# Patient Record
Sex: Male | Born: 1975 | Race: White | Hispanic: No | Marital: Married
Health system: Southern US, Community
[De-identification: ages and names within clinical notes are randomized; demographics above are authoritative.]

---

## 2010-11-01 ENCOUNTER — Emergency Department (HOSPITAL_BASED_OUTPATIENT_CLINIC_OR_DEPARTMENT_OTHER)
Admission: EM | Admit: 2010-11-01 | Discharge: 2010-11-01 | Disposition: A | Discharge status: Home / Self Care | Payer: BC Managed Care – PPO | Attending: Emergency Medicine | Admitting: Emergency Medicine

## 2010-11-01 ENCOUNTER — Emergency Department (INDEPENDENT_AMBULATORY_CARE_PROVIDER_SITE_OTHER): Payer: BC Managed Care – PPO

## 2010-11-01 DIAGNOSIS — R55 Syncope and collapse: Secondary | ICD-10-CM

## 2010-11-01 DIAGNOSIS — R079 Chest pain, unspecified: Secondary | ICD-10-CM

## 2010-11-01 DIAGNOSIS — J45909 Unspecified asthma, uncomplicated: Secondary | ICD-10-CM | POA: Insufficient documentation

## 2010-11-01 LAB — COMPREHENSIVE METABOLIC PANEL
ALT: 53 U/L (ref 0–53)
AST: 51 U/L — ABNORMAL HIGH (ref 0–37)
CO2: 30 mEq/L (ref 19–32)
Calcium: 9.2 mg/dL (ref 8.4–10.5)
GFR calc Af Amer: >60 mL/min (ref >60)
Sodium: 142 mEq/L (ref 135–145)
Total Protein: 7.5 g/dL (ref 6.0–8.3)

## 2010-11-01 LAB — CBC
Hemoglobin: 15 g/dL (ref 13.0–17.0)
MCHC: 36.1 g/dL — ABNORMAL HIGH (ref 30.0–36.0)
RDW: 11.9 % (ref 11.5–15.5)
WBC: 7.6 10*3/uL (ref 4.0–10.5)

## 2010-11-01 LAB — POCT CARDIAC MARKERS
CKMB, poc: <1 ng/mL — ABNORMAL LOW (ref 1.0–8.0)
Troponin i, poc: <0.05 ng/mL (ref 0.00–0.09)

## 2010-11-01 LAB — DIFFERENTIAL
Basophils Absolute: 0 10*3/uL (ref 0.0–0.1)
Basophils Relative: 0 % (ref 0–1)
Monocytes Absolute: 0.6 10*3/uL (ref 0.1–1.0)
Neutro Abs: 4.6 10*3/uL (ref 1.7–7.7)

## 2012-04-25 IMAGING — CT CT HEAD W/O CM
1 series · 16 of 30 positions shown, 20 images · non-contrast
Comparison: None

CLINICAL DATA: Syncope.  Head trauma.

CT HEAD WITHOUT CONTRAST
TECHNIQUE: Contiguous axial images were obtained from the base of
the skull through the vertex without contrast.

[Series 2: head 4.8 h37s · axial · 0.48mm/px · z∈[-163,-11]mm · 16 of 36 slices shown, 20 images]
[im 2/36  brain]
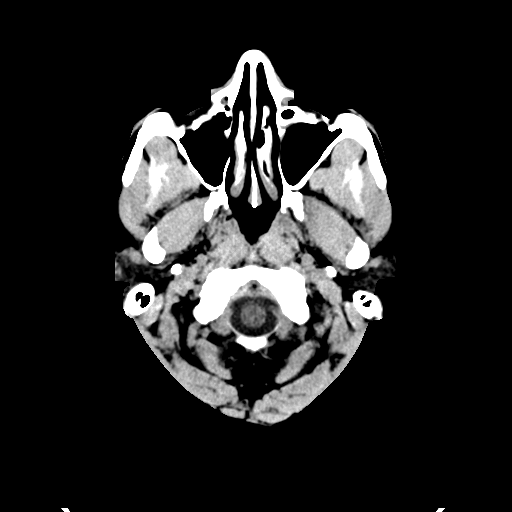
[im 2/36  bone]
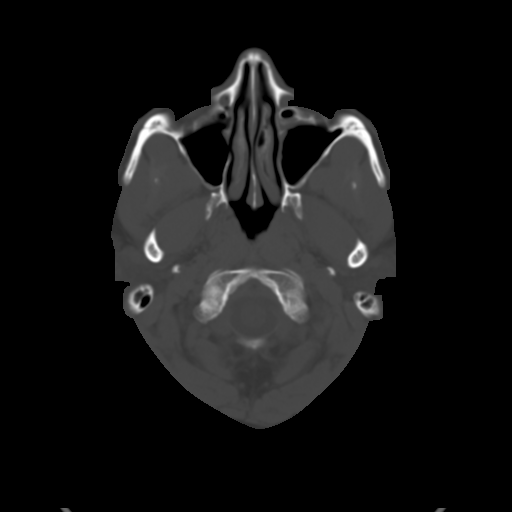
[im 4/36  brain]
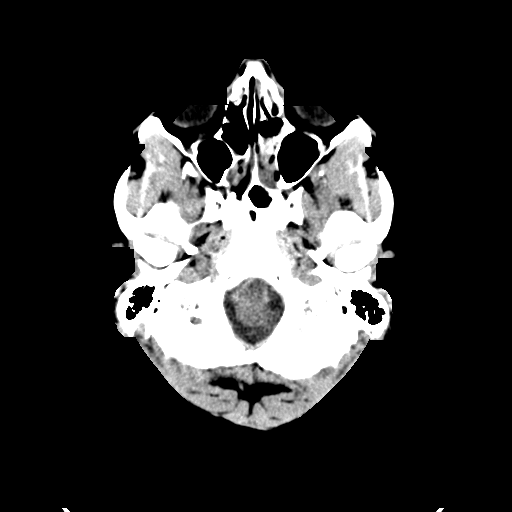
[im 7/36  brain]
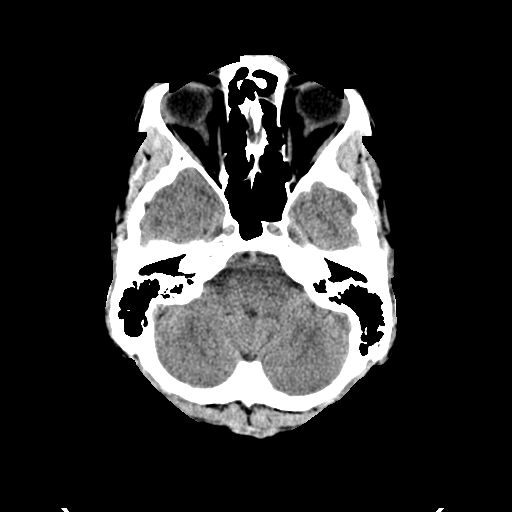
[im 9/36  brain]
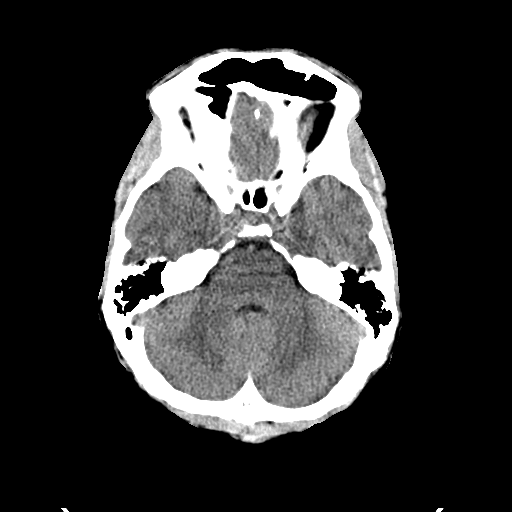
[im 10/36  brain]
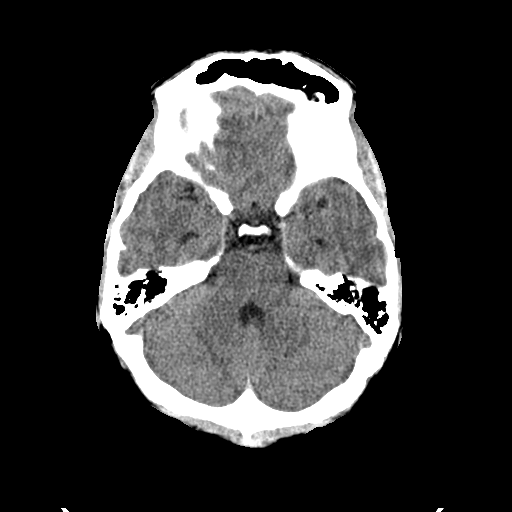
[im 10/36  bone]
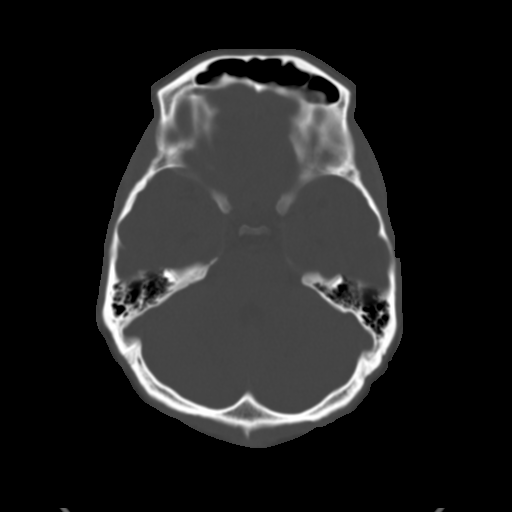
[im 13/36  brain]
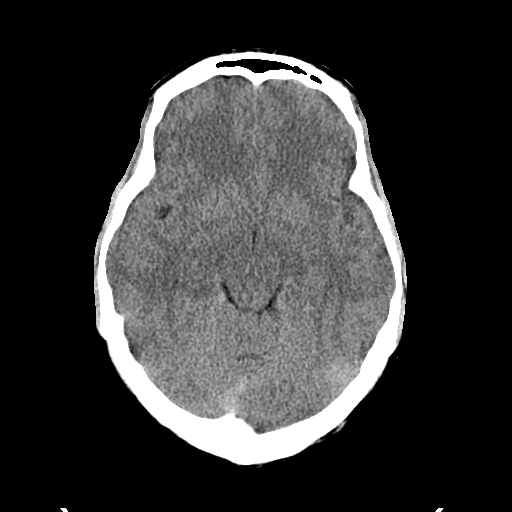
[im 15/36  brain]
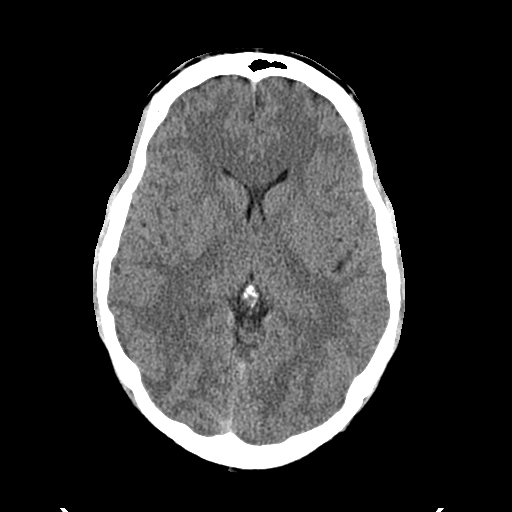
[im 17/36  brain]
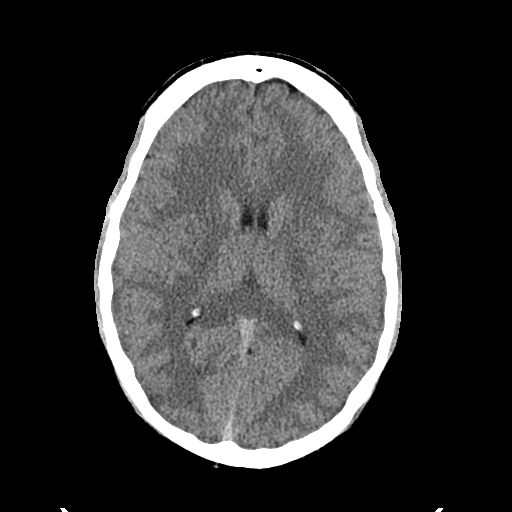
[im 19/36  brain]
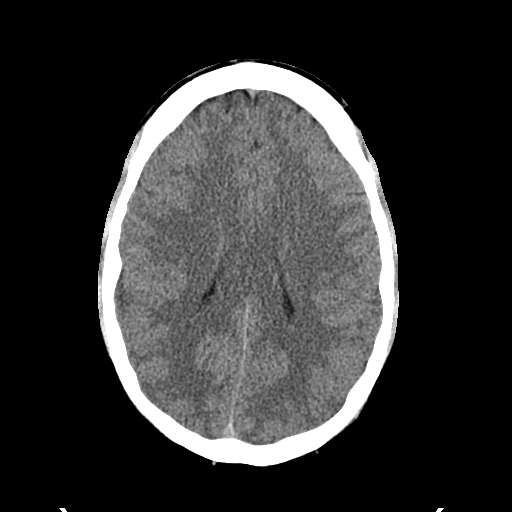
[im 19/36  bone]
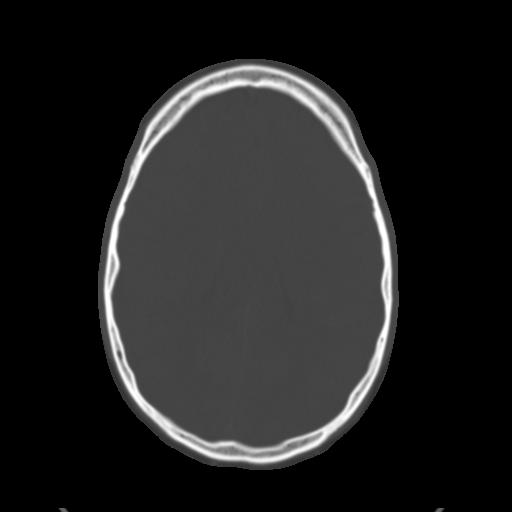
[im 21/36  brain]
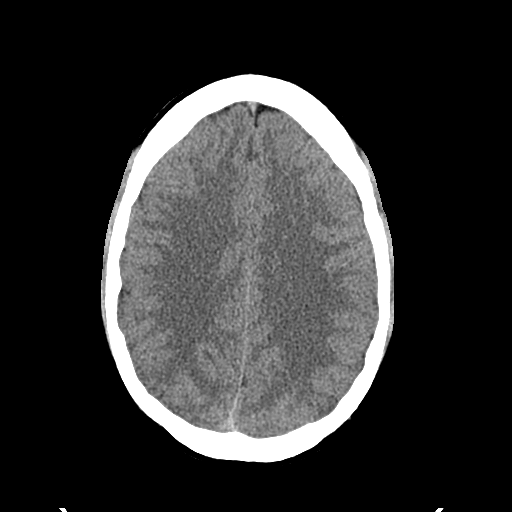
[im 23/36  brain]
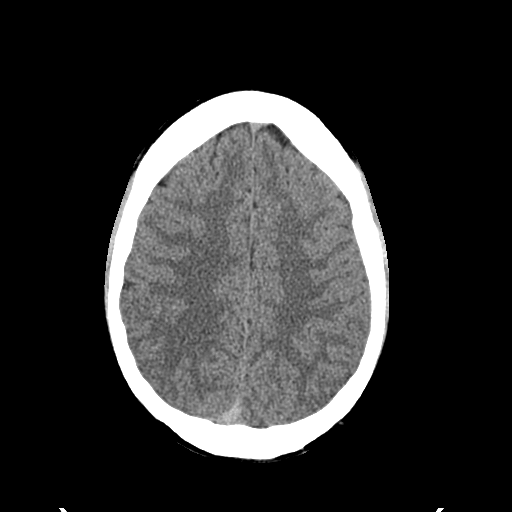
[im 26/36  brain]
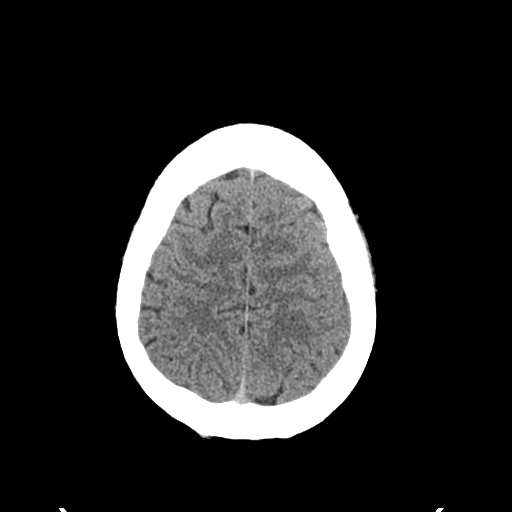
[im 27/36  brain]
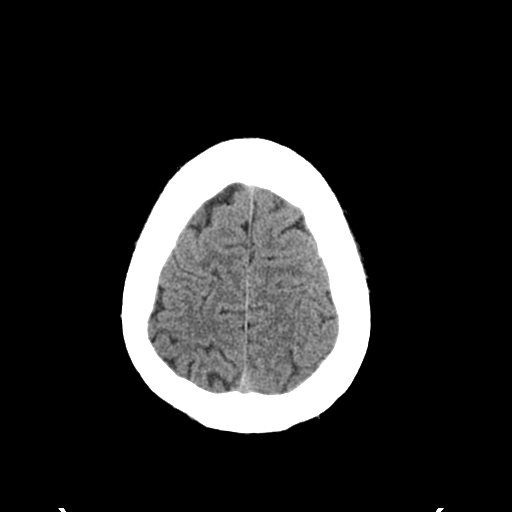
[im 27/36  bone]
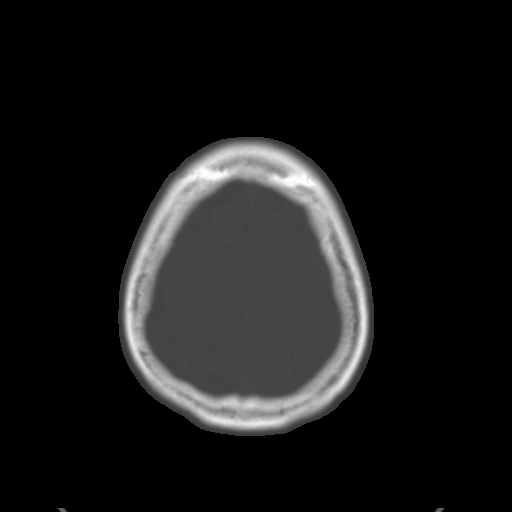
[im 29/36  brain]
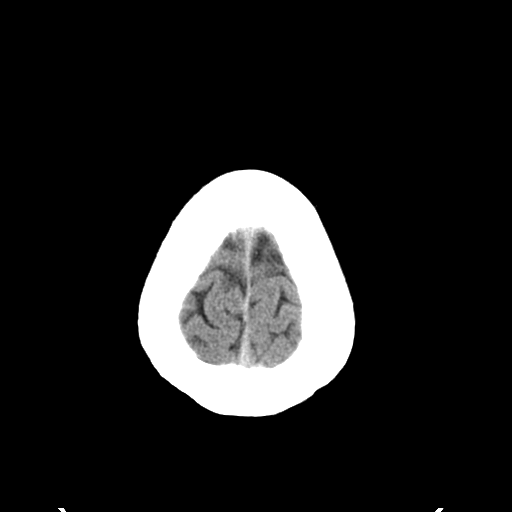
[im 32/36  brain]
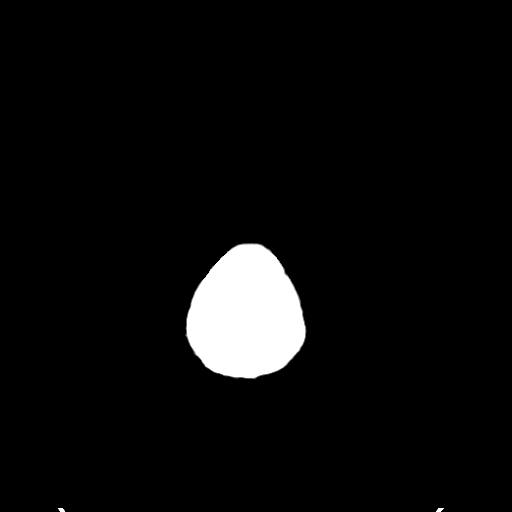
[im 34/36  brain]
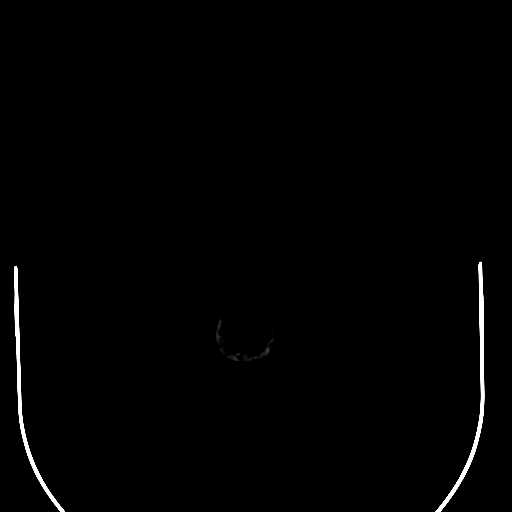

[16 of 30 positions shown; findings below may reference images not displayed]

FINDINGS: The brain has a normal appearance without evidence of
malformation, atrophy, old or acute infarction, mass lesion,
hemorrhage, hydrocephalus or extra-axial collection.  No skull
fracture.  No significant sinus disease.
IMPRESSION: Normal head CT

## 2012-04-25 IMAGING — CR DG CHEST 2V
2 series · 2 of 2 positions shown · non-contrast
Comparison: None.

CLINICAL DATA: Syncope.  History of asthma.

CHEST - 2 VIEW

[w chest pa]
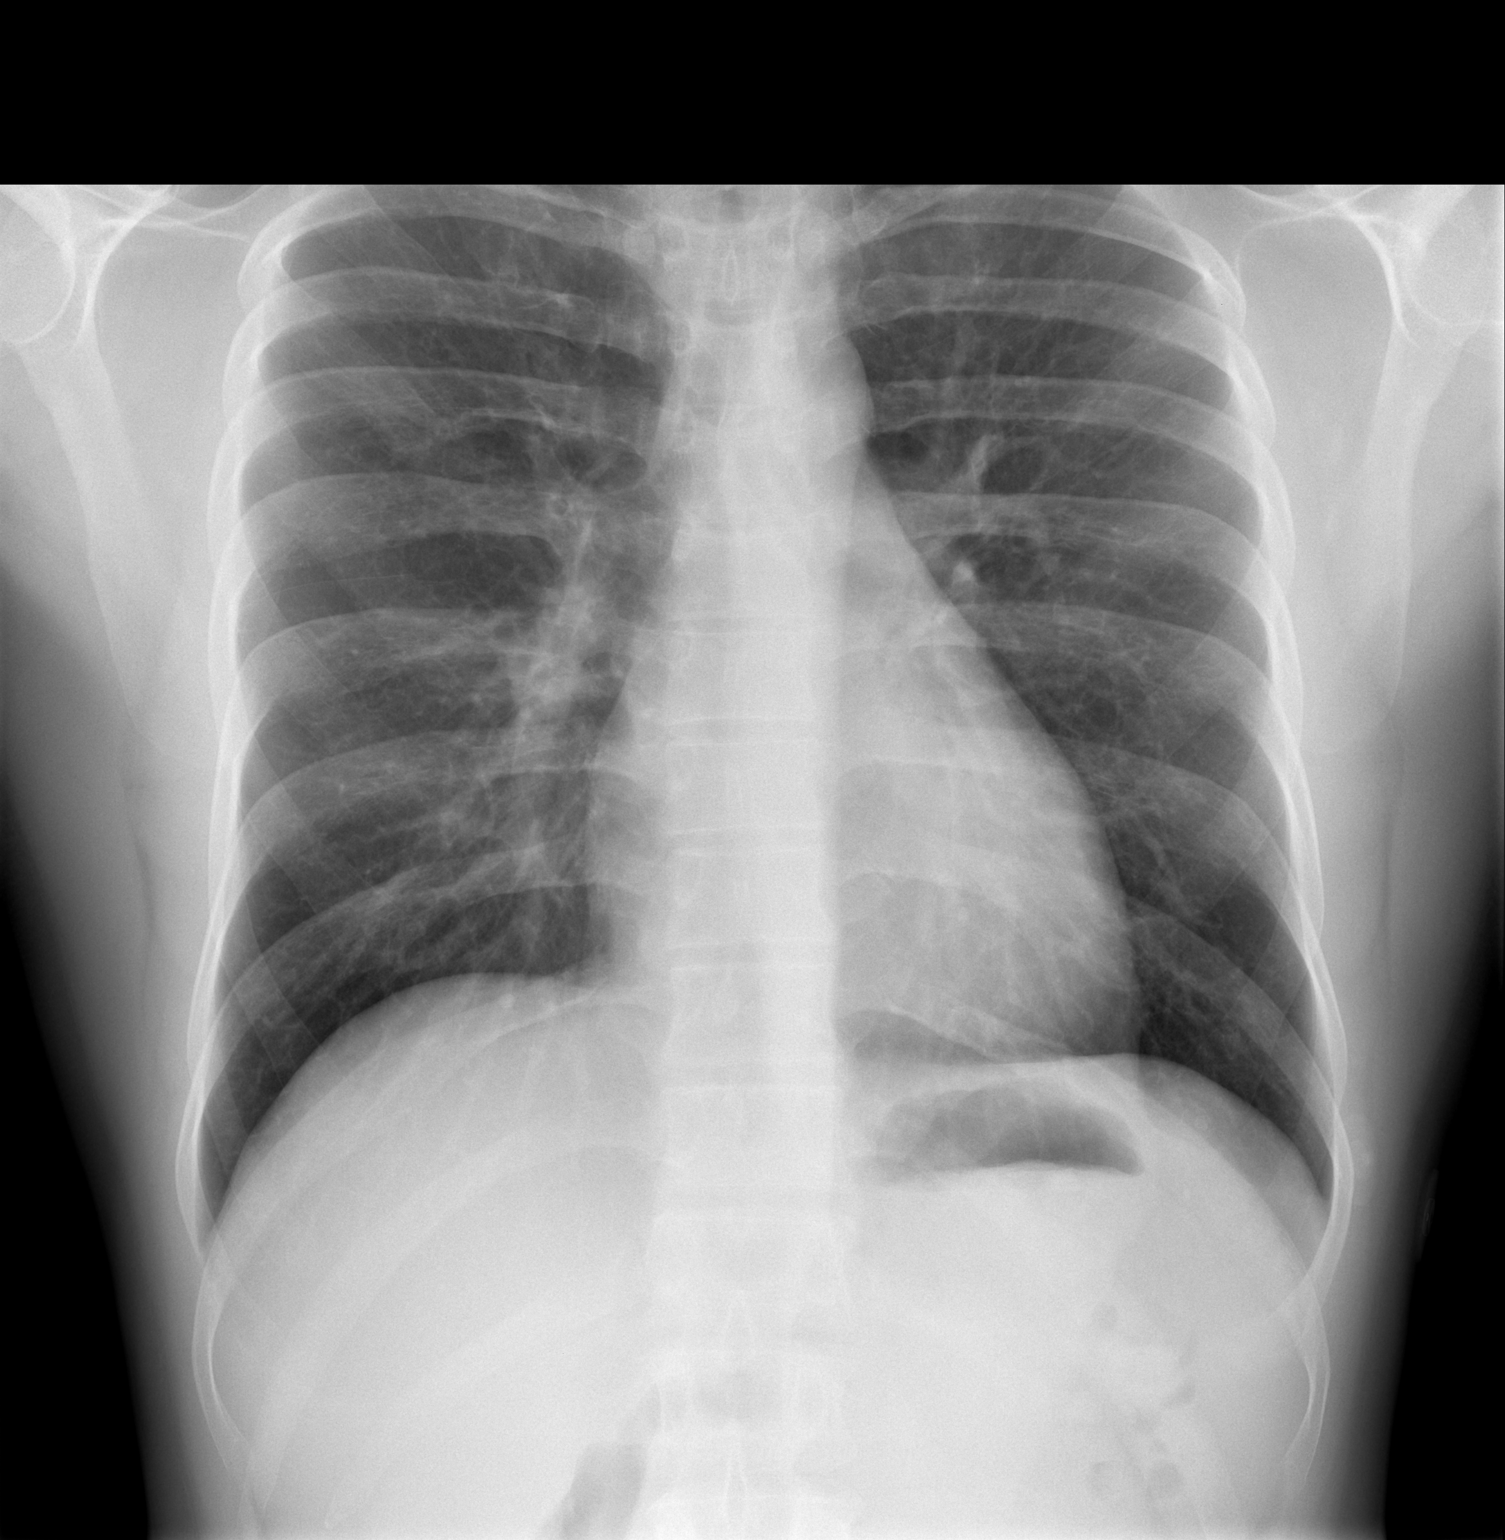

[w chest lat]
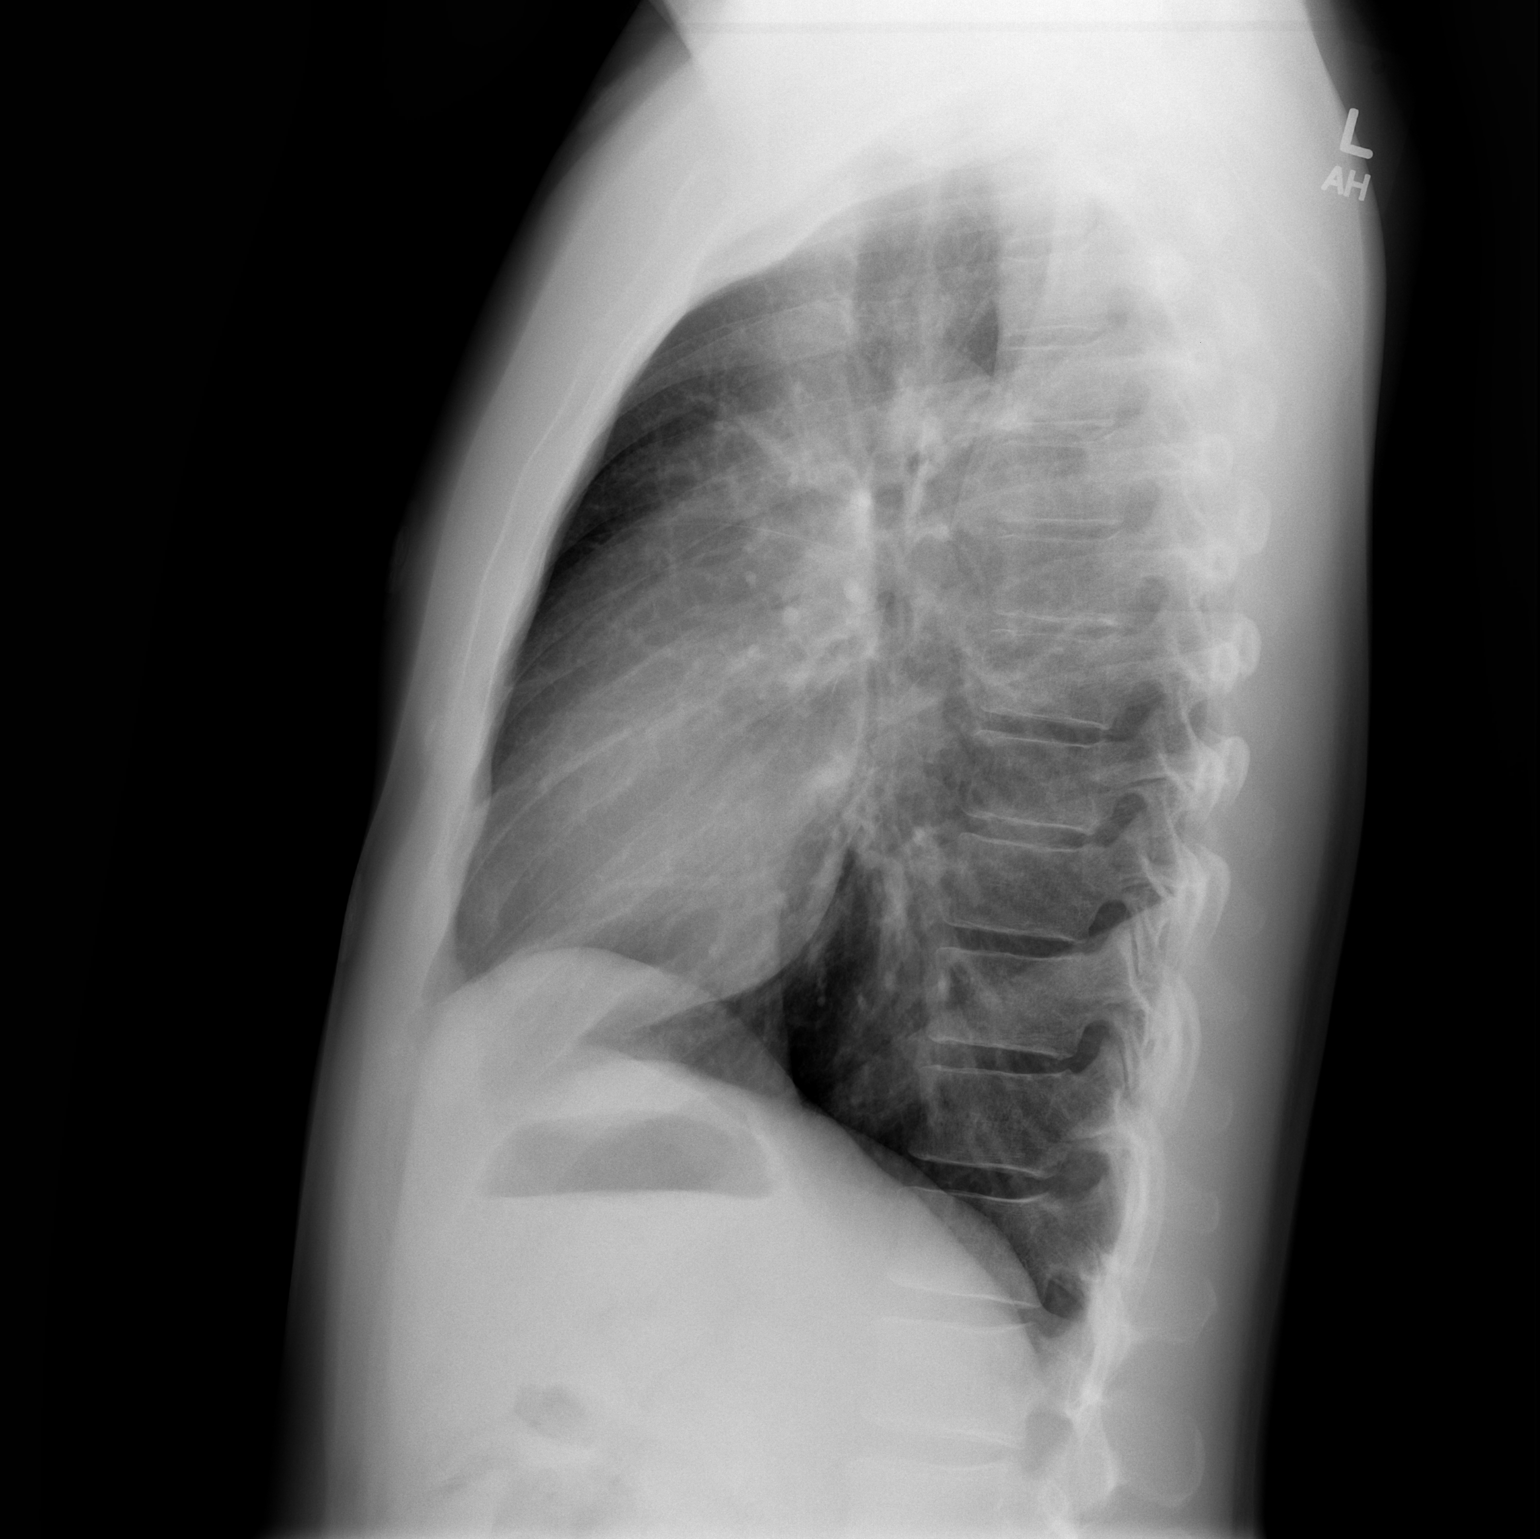

[2 of 2 positions shown; findings below may reference images not displayed]

FINDINGS: Cardiac and mediastinal contours appear unremarkable.

Mild airway thickening noted.

The lungs appear otherwise clear.  No pleural effusion noted.
IMPRESSION: 1.  Mild airway thickening may reflect bronchitis or reactive
airways disease.

## 2016-06-09 DIAGNOSIS — J45909 Unspecified asthma, uncomplicated: Secondary | ICD-10-CM | POA: Insufficient documentation

## 2016-06-09 DIAGNOSIS — K219 Gastro-esophageal reflux disease without esophagitis: Secondary | ICD-10-CM | POA: Insufficient documentation

## 2016-07-30 ENCOUNTER — Ambulatory Visit
Admission: RE | Admit: 2016-07-30 | Discharge: 2016-07-30 | Disposition: A | Discharge status: Home / Self Care | Payer: BLUE CROSS/BLUE SHIELD | Source: Ambulatory Visit | Attending: Allergy and Immunology | Admitting: Allergy and Immunology

## 2016-07-30 ENCOUNTER — Other Ambulatory Visit: Payer: Self-pay | Admitting: Allergy and Immunology

## 2016-07-30 DIAGNOSIS — J209 Acute bronchitis, unspecified: Secondary | ICD-10-CM

## 2021-09-15 DIAGNOSIS — J301 Allergic rhinitis due to pollen: Secondary | ICD-10-CM | POA: Insufficient documentation

## 2021-09-19 DIAGNOSIS — E782 Mixed hyperlipidemia: Secondary | ICD-10-CM | POA: Insufficient documentation

## 2021-09-19 DIAGNOSIS — I10 Essential (primary) hypertension: Secondary | ICD-10-CM | POA: Insufficient documentation

## 2022-09-09 NOTE — Progress Notes (Signed)
[place] Phone: [phone] Subjective:   Fontaine No, am serving as a scribe for Dr. Hulan Saas.  I'm seeing this patient by the request  of:  Kristopher Glee., MD  CC:  Knee pain [id number]  Joseph Wiggins is a 47 y.o. male coming in with complaint of L knee pain. Patient said that he was facing closet and planted L leg and turned. Felt a pop. Pain over medial and superior joint. Did have some knee pain last year and had an xray. Patient is trying to get back into working out to lose weight. Painful to walk but not pain with sitting.       No past medical history on file.  Social History   Socioeconomic History   Marital status: Married    Spouse name: Not on file   Number of children: Not on file   Years of education: Not on file   Highest education level: Not on file  Occupational History   Not on file  Tobacco Use   Smoking status: Not on file   Smokeless tobacco: Not on file  Substance and Sexual Activity   Alcohol use: Not on file   Drug use: Not on file   Sexual activity: Not on file  Other Topics Concern   Not on file  Social History Narrative   Not on file   Social Determinants of Health   Financial Resource Strain: Not on file  Food Insecurity: Not on file  Transportation Needs: Not on file  Physical Activity: Not on file  Stress: Not on file  Social Connections: Not on file   Not on File No family history on file.   Current Outpatient Medications (Cardiovascular):    losartan (COZAAR) 25 MG tablet, Take 25 mg by mouth daily.  Current Outpatient Medications (Respiratory):    fluticasone-salmeterol (ADVAIR) 100-50 MCG/ACT AEPB, Inhale 1 puff into the lungs 2 (two) times daily.   levocetirizine (XYZAL) 5 MG tablet, Take 5 mg by mouth every evening.    Current Outpatient Medications (Other):    Esomeprazole Magnesium (NEXIUM 24HR PO), Take by mouth.   Omega-3 Fatty  Acids (FISH OIL) 1000 MG CPDR, Take by mouth.   Reviewed prior external information including notes and imaging from  primary care provider As well as notes that were available from care everywhere and other healthcare systems.  Past medical history, social, surgical and family history all reviewed in electronic medical record.  No pertanent information unless stated regarding to the chief complaint.   Review of Systems:  No headache, visual changes, nausea, vomiting, diarrhea, constipation, dizziness, abdominal pain, skin rash, fevers, chills, night sweats, weight loss, swollen lymph nodes, body aches, joint swelling, chest pain, shortness of breath, mood changes. POSITIVE muscle aches  Objective  Blood pressure 122/82, pulse 73, height '5\' 9"'$  (1.753 m), weight 188 lb (85.3 kg), SpO2 98 %.   General: No apparent distress alert and oriented x3 mood and affect normal, dressed appropriately.  HEENT: Pupils equal, extraocular movements intact  Respiratory: Patient's speak in full sentences and does not appear short of breath  Cardiovascular: No lower extremity edema, non tender, no erythema  Left knee exam does not have any significant swelling noted.  Very mild tenderness to palpation over the medial joint line.  Fairly significantly as well as positive Thessaly's.  Limited muscular skeletal ultrasound was performed and interpreted by Hulan Saas, M  Limited ultrasound of patient's  left knee does show a abnormality noted at the anterior horn of the medial meniscus with very mild displacement of 5 to 50% mild hypoechoic changes surrounding the area.  Minimal discomfort with compression on the probe in the area. Impression: Medial meniscus injury    Impression and Recommendations:     The above documentation has been reviewed and is accurate and complete Lyndal Pulley, DO

## 2022-09-10 ENCOUNTER — Encounter: Payer: Self-pay | Admitting: Family Medicine

## 2022-09-10 ENCOUNTER — Ambulatory Visit: Payer: BLUE CROSS/BLUE SHIELD | Admitting: Family Medicine

## 2022-09-10 ENCOUNTER — Ambulatory Visit: Payer: Self-pay

## 2022-09-10 VITALS — BP 122/82 | HR 73 | Ht 69.0 in | Wt 188.0 lb

## 2022-09-10 DIAGNOSIS — M25562 Pain in left knee: Secondary | ICD-10-CM | POA: Diagnosis not present

## 2022-09-10 DIAGNOSIS — M23312 Other meniscus derangements, anterior horn of medial meniscus, left knee: Secondary | ICD-10-CM | POA: Diagnosis not present

## 2022-09-10 NOTE — Assessment & Plan Note (Signed)
Small 5 to 10% displacement noted.  Does seem to be acute on chronic.  Discussed icing regimen and home exercises, discussed which activities to do and which ones to avoid, increase activity slowly.  Patient also given a Tru pull lite brace to help with some stability of the kneecap and given exercises by athletic trainer and work on [employer] strengthening.  Increase activity slowly and follow-up with me again in 6 weeks.  Worsening pain consider formal physical therapy or advanced imaging.

## 2022-09-10 NOTE — Patient Instructions (Signed)
Exercises Tru pull lite VMO exercises Ice and Voltaren after activity Avoid twisting Vit D 2000 IU daily Tart Cherry extract '1200mg'$  at night See me in 7 weeks

## 2022-10-22 ENCOUNTER — Ambulatory Visit: Payer: BLUE CROSS/BLUE SHIELD | Admitting: Family Medicine

## 2022-10-28 NOTE — Progress Notes (Unsigned)
[facility] [address] [place] [address] Phone: [phone] Subjective:   Joseph Wiggins, am serving as a scribe for Dr. Antoine Primas.  I'm seeing this patient by the request  of:  Andreas Blower., MD  CC: Knee pain follow-up  [id number]  09/10/2022 Small 5 to 10% displacement noted. Does seem to be acute on chronic. Discussed icing regimen and home exercises, discussed which activities to do and which ones to avoid, increase activity slowly. Patient also given a Tru pull lite brace to help with some stability of the kneecap and given exercises by athletic trainer and work on [employer] strengthening. Increase activity slowly and follow-up with me again in 6 weeks. Worsening pain consider formal physical therapy or advanced imaging.    Update 10/29/2022 Joseph Wiggins is a 47 y.o. male coming in with complaint of L knee pain. Patient states feeling better, but still feels weird. Sitting to standing a little tightness. Making sure trending in the right direction.    No past medical history on file. No past surgical history on file. Social History   Socioeconomic History   Marital status: Married    Spouse name: Not on file   Number of children: Not on file   Years of education: Not on file   Highest education level: Not on file  Occupational History   Not on file  Tobacco Use   Smoking status: Not on file   Smokeless tobacco: Not on file  Substance and Sexual Activity   Alcohol use: Not on file   Drug use: Not on file   Sexual activity: Not on file  Other Topics Concern   Not on file  Social History Narrative   Not on file   Social Determinants of Health   Financial Resource Strain: Not on file  Food Insecurity: Not on file  Transportation Needs: Not on file  Physical Activity: Not on file  Stress: Not on file  Social Connections: Not on file   Not on File No family history on file.   Current Outpatient Medications  (Cardiovascular):    losartan (COZAAR) 25 MG tablet, Take 25 mg by mouth daily.  Current Outpatient Medications (Respiratory):    fluticasone-salmeterol (ADVAIR) 100-50 MCG/ACT AEPB, Inhale 1 puff into the lungs 2 (two) times daily.   levocetirizine (XYZAL) 5 MG tablet, Take 5 mg by mouth every evening.    Current Outpatient Medications (Other):    Esomeprazole Magnesium (NEXIUM 24HR PO), Take by mouth.   Omega-3 Fatty Acids (FISH OIL) 1000 MG CPDR, Take by mouth.    Objective  Blood pressure 124/80, pulse 64, height  (1.753 m), weight 189 lb (85.7 kg), SpO2 98 %.   General: No apparent distress alert and oriented x3 mood and affect normal, dressed appropriately.  HEENT: Pupils equal, extraocular movements intact  Respiratory: Patient's speak in full sentences and does not appear short of breath  Cardiovascular: No lower extremity edema, non tender, no erythema  Ambulates without any significant limp.  The patient is still tender to palpation of the medial joint line.  Patient has good range of motion but mild crepitus of the patella.  Negative Thessaly's  Limited muscular skeletal ultrasound was performed and interpreted by Antoine Primas, M  Limited ultrasound shows the patient does have mild hypoechoic change of the patellofemoral joint.  Medial meniscus appears to have no significant displacement but still has some of the chronic changes of her medial meniscus. Impression: Interval improvement  Impression and Recommendations:    The above documentation has been reviewed and is accurate and complete Lyndal Pulley, DO

## 2022-10-29 ENCOUNTER — Other Ambulatory Visit: Payer: Self-pay

## 2022-10-29 ENCOUNTER — Encounter: Payer: Self-pay | Admitting: Family Medicine

## 2022-10-29 ENCOUNTER — Ambulatory Visit: Payer: No Typology Code available for payment source | Admitting: Family Medicine

## 2022-10-29 VITALS — BP 124/80 | HR 64 | Ht 69.0 in | Wt 189.0 lb

## 2022-10-29 DIAGNOSIS — M25562 Pain in left knee: Secondary | ICD-10-CM | POA: Diagnosis not present

## 2022-10-29 DIAGNOSIS — M23312 Other meniscus derangements, anterior horn of medial meniscus, left knee: Secondary | ICD-10-CM

## 2022-10-29 NOTE — Patient Instructions (Signed)
Good to see you! Overall everything looks healed and good See you again in 8 weeks if not perfect Send message earlier if you get locking or giving way

## 2022-10-29 NOTE — Assessment & Plan Note (Signed)
Internal derangement shows that patient seems to be resolved at this time.  Still having some discomfort that could be secondary to the underlying arthritic changes noted of the knee.  The patellofemoral area.  Discussed with patient about icing regimen and home exercises otherwise.  Follow-up with me in 2 months after increasing activity.  If having worsening discomfort will need to consider advanced imaging.

## 2022-12-22 ENCOUNTER — Ambulatory Visit: Payer: No Typology Code available for payment source | Admitting: Family Medicine
